# Patient Record
Sex: Female | Born: 2005 | Race: Black or African American | Hispanic: No | Marital: Single | Smoking: Never smoker
Health system: Southern US, Community
[De-identification: ages and names within clinical notes are randomized; demographics above are authoritative.]

## PROBLEM LIST (undated history)

## (undated) DIAGNOSIS — J45909 Unspecified asthma, uncomplicated: Secondary | ICD-10-CM

## (undated) DIAGNOSIS — L309 Dermatitis, unspecified: Secondary | ICD-10-CM

## (undated) HISTORY — DX: Dermatitis, unspecified: L30.9

## (undated) HISTORY — PX: OTHER SURGICAL HISTORY: SHX169

## (undated) HISTORY — DX: Unspecified asthma, uncomplicated: J45.909

---
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
## 2023-02-18 ENCOUNTER — Encounter: Payer: Self-pay | Admitting: *Deleted

## 2023-04-30 ENCOUNTER — Emergency Department: Payer: Medicaid Other

## 2023-04-30 ENCOUNTER — Encounter: Payer: Self-pay | Admitting: Emergency Medicine

## 2023-04-30 ENCOUNTER — Emergency Department
Admission: EM | Admit: 2023-04-30 | Discharge: 2023-04-30 | Disposition: A | Discharge status: Home / Self Care | Payer: Medicaid Other | Attending: Emergency Medicine | Admitting: Emergency Medicine

## 2023-04-30 ENCOUNTER — Other Ambulatory Visit: Payer: Self-pay

## 2023-04-30 DIAGNOSIS — J4521 Mild intermittent asthma with (acute) exacerbation: Secondary | ICD-10-CM | POA: Diagnosis not present

## 2023-04-30 DIAGNOSIS — R062 Wheezing: Secondary | ICD-10-CM | POA: Diagnosis present

## 2023-04-30 LAB — BASIC METABOLIC PANEL
Anion gap: 10 (ref 5–15)
BUN: 10 mg/dL (ref 6–20)
CO2: 20 mmol/L — ABNORMAL LOW (ref 22–32)
Calcium: 8.5 mg/dL — ABNORMAL LOW (ref 8.9–10.3)
Chloride: 103 mmol/L (ref 98–111)
Creatinine, Ser: 0.82 mg/dL (ref 0.44–1.00)
GFR, Estimated: >60 mL/min (ref >60)
Glucose, Bld: 89 mg/dL (ref 70–99)
Potassium: 3.1 mmol/L — ABNORMAL LOW (ref 3.5–5.1)
Sodium: 133 mmol/L — ABNORMAL LOW (ref 135–145)

## 2023-04-30 LAB — CBC
HCT: 33.5 % — ABNORMAL LOW (ref 36.0–46.0)
Hemoglobin: 11.1 g/dL — ABNORMAL LOW (ref 12.0–15.0)
MCH: 25.5 pg — ABNORMAL LOW (ref 26.0–34.0)
MCHC: 33.1 g/dL (ref 30.0–36.0)
MCV: 77 fL — ABNORMAL LOW (ref 80.0–100.0)
Platelets: 346 10*3/uL (ref 150–400)
RBC: 4.35 MIL/uL (ref 3.87–5.11)
RDW: 16.3 % — ABNORMAL HIGH (ref 11.5–15.5)
WBC: 7.4 10*3/uL (ref 4.0–10.5)
nRBC: 0 % (ref 0.0–0.2)

## 2023-04-30 LAB — TROPONIN I (HIGH SENSITIVITY): Troponin I (High Sensitivity): <2 ng/L (ref <18)

## 2023-04-30 MED ORDER — MOMETASONE FURO-FORMOTEROL FUM 100-5 MCG/ACT IN AERO: 2.0000 | INHALATION_SPRAY | Freq: Two times a day (BID) | RESPIRATORY_TRACT | 0 refills | Status: AC

## 2023-04-30 MED ORDER — PREDNISONE 20 MG PO TABS: 40.0000 mg | ORAL_TABLET | Freq: Every day | ORAL | 0 refills | Status: AC

## 2023-04-30 MED ORDER — ALBUTEROL SULFATE (2.5 MG/3ML) 0.083% IN NEBU: 2.5000 mg | INHALATION_SOLUTION | Freq: Four times a day (QID) | RESPIRATORY_TRACT | 1 refills | Status: AC | PRN

## 2023-04-30 MED ORDER — POTASSIUM CHLORIDE CRYS ER 20 MEQ PO TBCR
40.0000 meq | EXTENDED_RELEASE_TABLET | Freq: Once | ORAL | Status: DC
Start: 2023-04-30 — End: 2023-04-30
  Filled 2023-04-30: qty 2

## 2023-04-30 MED ORDER — COMPRESSOR/NEBULIZER MISC: 1.0000 [IU] | Freq: Once | 0 refills | Status: AC

## 2023-04-30 MED ORDER — PREDNISONE 20 MG PO TABS
60.0000 mg | ORAL_TABLET | Freq: Once | ORAL | Status: AC
  Administered 2023-04-30: 60 mg via ORAL
  Filled 2023-04-30: qty 3

## 2023-04-30 MED ORDER — ALBUTEROL SULFATE HFA 108 (90 BASE) MCG/ACT IN AERS: 1.0000 | INHALATION_SPRAY | RESPIRATORY_TRACT | 1 refills | Status: DC | PRN

## 2023-04-30 NOTE — Discharge Instructions (Addendum)
We believe that your symptoms are caused today by an exacerbation of your asthma.  Please take the prescribed medications and any medications that you have at home.  Follow up with your doctor as recommended.  If you develop any new or worsening symptoms, including but not limited to fever, persistent vomiting, worsening shortness of breath, or other symptoms that concern you, please return to the Emergency Department immediately. ° °

## 2023-04-30 NOTE — ED Provider Notes (Signed)
Vitals:   04/30/23 0040 04/30/23 0145  BP: 121/77 109/70  Pulse: (!) 104 (!) 101  Resp: (!) 24 19  Temp: 97.9 F (36.6 C) 98.2 F (36.8 C)  SpO2: 100% 98%     Patient resting comfortably, no distress.  She appears very appropriate for discharge.  Oxygen saturation and work of breathing are normal at this time.  Lung sounds are clear.  She reports a long history of asthma, I have refilled many of her medications that she has since run out of since she is now transitioning from pediatrics to adult care.  He has an appointment coming up early July to establish primary care.  No ongoing evidence of to suggest persistent exacerbation.  Return precautions and treatment recommendations and follow-up discussed with the patient who is agreeable with the plan.    Sharyn Creamer, MD 04/30/23 [phone]

## 2023-04-30 NOTE — ED Provider Notes (Signed)
[facility] Provider Note    Event Date/Time   First MD Initiated Contact with Patient 04/30/23 0100     (approximate)   History   Shortness of Breath and Asthma   HPI  Elaine Bright is a 17 y.o. female   with a history of asthma.  She reports that she recently started a new job.  She has been working in a fairly dusty environment and has noticed that she started having a little bit of wheezing today.  She has been out of her [facility] for some time and used to take but no longer takes an antihistamine like Zyrtec.    No chest pain.  She reports a little bit of shortness of breath and wheezing and is starting to feel improved.  She used her inhaler several times prior to calling EMS and was given steroid and albuterol by EMS.  She is feeling quite a bit better at this time.  Reports overall feeling much improved.     Physical Exam   Triage Vital Signs: ED Triage Vitals  Enc Vitals Group     BP 04/30/23 0040 121/77     Pulse Rate 04/30/23 0040 (!) 104     Resp 04/30/23 0040 (!) 24     Temp 04/30/23 0040 97.9 F (36.6 C)     Temp Source 04/30/23 0040 Oral     SpO2 04/30/23 0040 100 %     Weight 04/30/23 0041 143 lb (64.9 kg)     Height 04/30/23 0041 5\' 2"  (1.575 m)     Head Circumference --      Peak Flow --      Pain Score 04/30/23 0041 8     Pain Loc --      Pain Edu? --      Excl. in GC? --     Most recent vital signs: Vitals:   04/30/23 0040 04/30/23 0145  BP: 121/77 109/70  Pulse: (!) 104 (!) 101  Resp: (!) 24 19  Temp: 97.9 F (36.6 C) 98.2 F (36.8 C)  SpO2: 100% 98%     General: Awake, no distress.  CV:  Good peripheral perfusion.  Normal tones and rate Resp:  Normal effort.  Lung sounds clear except for very slight amount of expiratory wheezing.  Her work of breathing appears quite normal oxygen saturation normal.  She reports she is feeling well at this time Abd:  No distention.  No abdominal tenderness.  Denies  pregnancy Other:     ED Results / Procedures / Treatments   Labs (all labs ordered are listed, but only abnormal results are displayed) Labs Reviewed  BASIC METABOLIC PANEL - Abnormal; Notable for the following components:      Result Value   Sodium 133 (*)    Potassium 3.1 (*)    CO2 20 (*)    Calcium 8.5 (*)    All other components within normal limits  CBC - Abnormal; Notable for the following components:   Hemoglobin 11.1 (*)    HCT 33.5 (*)    MCV 77.0 (*)    MCH 25.5 (*)    RDW 16.3 (*)    All other components within normal limits  TROPONIN I (HIGH SENSITIVITY)     EKG EKG inter by me at 1:35 AM heart rate 95 QRS 90 QTc 460 Normal sinus rhythm no evidence of acute ischemia or ectopy   RADIOLOGY  Chest x-ray interpreted by me as negative for acute finding  DG Chest 2 View  Result Date: 04/30/2023 CLINICAL DATA:  Wheezing and shortness of breath. EXAM: CHEST - 2 VIEW COMPARISON:  June 29, 2022 FINDINGS: The heart size and mediastinal contours are within normal limits. Both lungs are clear. The visualized skeletal structures are unremarkable. IMPRESSION: No active cardiopulmonary disease. Electronically Signed   By: Aram Candela M.D.   On: 04/30/2023 01:13      PROCEDURES:  Critical Care performed: No  Procedures   MEDICATIONS ORDERED IN ED: Medications  predniSONE (DELTASONE) tablet 60 mg (60 mg Oral Given 04/30/23 0130)     IMPRESSION / MDM / ASSESSMENT AND PLAN / ED COURSE  I reviewed the triage vital signs and the nursing notes.                              Differential diagnosis includes, but is not limited to, suspect mild asthma exacerbation now improving after treatment prior to arrival by patient as well as EMS, with only slight amount of wheezing but she reports significant improvement in work of breathing.  No associated chest pain.  Reassuring examination.  History of asthma, she is currently transitioning between PCP who is new and  her pediatrician.  She is currently not taking Zyrtec or Dulera.  She also reports that she needs a new mask for her home nebulizer and refill of albuterol MDI.  There is no evidence of acute coronary syndrome reassuring EKG normal initial troponin low risk and low pretest probability for ACS.  No associated chest pain.  Mild hypokalemia, will replete with dose of potassium here.  CBC reassuring except for very mild anemia.  Anemia does not appear to be enough to cause dyspnea.  Reassessed the patient, lung sounds clear denies any ongoing dyspnea this time.  She reports significant improvement, is resting in quite comfortable at this time.  Have sent several prescriptions in for the patient She will also start Zyrtec over-the-counter.  Discussed with both patient and her mother very careful return precautions she is comfortable with plan  Patient's presentation is most consistent with acute complicated illness / injury requiring diagnostic workup.          FINAL CLINICAL IMPRESSION(S) / ED DIAGNOSES   Final diagnoses:  Mild intermittent asthma with exacerbation     Rx / DC Orders   ED Discharge Orders          Ordered    mometasone-formoterol (DULERA) 100-5 MCG/ACT AERO  2 times daily        04/30/23 0131    predniSONE (DELTASONE) 20 MG tablet  Daily with breakfast        04/30/23 0131    albuterol (VENTOLIN HFA) 108 (90 Base) MCG/ACT inhaler  Every 4 hours PRN        04/30/23 0131    Nebulizers (COMPRESSOR/NEBULIZER) MISC   Once        04/30/23 0131    albuterol (PROVENTIL) (2.5 MG/3ML) 0.083% nebulizer solution  Every 6 hours PRN        04/30/23 0131             Note:  This document was prepared using Dragon voice recognition software and may include unintentional dictation errors.   Sharyn Creamer, MD 04/30/23 [phone]
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
## 2023-05-22 ENCOUNTER — Ambulatory Visit: Payer: Self-pay | Admitting: Family Medicine

## 2023-05-22 ENCOUNTER — Encounter: Payer: Self-pay | Admitting: Family Medicine

## 2023-05-22 ENCOUNTER — Ambulatory Visit: Payer: Medicaid Other | Attending: Family Medicine | Admitting: Family Medicine

## 2023-05-22 VITALS — BP 104/72 | HR 82 | Temp 98.2°F | Ht 62.0 in | Wt 176.2 lb

## 2023-05-22 DIAGNOSIS — J4530 Mild persistent asthma, uncomplicated: Secondary | ICD-10-CM | POA: Diagnosis not present

## 2023-05-22 DIAGNOSIS — Z13228 Encounter for screening for other metabolic disorders: Secondary | ICD-10-CM | POA: Diagnosis not present

## 2023-05-22 MED ORDER — ALBUTEROL SULFATE HFA 108 (90 BASE) MCG/ACT IN AERS: 1.0000 | INHALATION_SPRAY | RESPIRATORY_TRACT | 3 refills | Status: AC | PRN

## 2023-05-22 MED ORDER — MISC. DEVICES MISC: 0 refills | Status: AC

## 2023-05-22 NOTE — Progress Notes (Signed)
ASTHMA

## 2023-05-22 NOTE — Patient Instructions (Signed)
Asthma Action Plan, Adult An asthma action plan helps you understand how to manage your asthma and what to do when you have an asthma attack. The action plan is a color-coded plan that lists the symptoms that indicate whether or not your condition is under control and what actions to take. If you have symptoms in the green zone, you are doing well. If you have symptoms in the yellow zone, you are having problems. If you have symptoms in the red zone, you need medical care right away. Follow the plan that you and your health care provider develop. Review your plan with your health care provider at each visit. What triggers your asthma? Knowing the things that can trigger an asthma attack or make your asthma symptoms worse is very important. Talk to your health care provider about your asthma triggers and how to avoid them. Record your known asthma triggers here: _______________ What is your personal best peak flow reading? If you use a peak flow meter, determine your personal best reading. Record it here: _______________ Green zone This zone means that your asthma is under control. You may not have any symptoms while you are in the green zone. This means that you: Have no coughing or wheezing, even while you are working or playing. Sleep through the night. Are breathing well. Have a peak flow reading that is above __________ (80% of your personal best or greater). If you are in the green zone, continue to manage your asthma as directed. Take these medicines every day: Controller medicine and dosage: _______________ Controller medicine and dosage: _______________ Controller medicine and dosage: _______________ Controller medicine and dosage: _______________ Before exercise, use this reliever or rescue medicine: _______________ Call your health care provider if you are using a reliever or rescue medicine more than 2-3 times a week. Yellow zone Symptoms in this zone mean that your condition may  be getting worse. You may have symptoms that interfere with exercise, are noticeably worse after exposure to triggers, or are worse at the first sign of a cold (upper respiratory infection). These may include: Waking from sleep. Coughing, especially at night or first thing in the morning. Mild wheezing. Chest tightness. A peak flow reading that is __________ to __________ (50-79% of your personal best). If you have any of these symptoms: Add the following medicine to the ones that you use daily: Reliever or rescue medicine and dosage: _______________ Additional medicine and dosage: _______________ Call your health care provider if: You remain in the yellow zone for __________ hours. You are using a reliever or rescue medicine more than 2-3 times a week. Red zone Symptoms in this zone mean that you should get medical help right away. You will likely feel distressed and have symptoms at rest that restrict your activity. You are in the red zone if: You are breathing hard and quickly. Your nose opens wide, your ribs show, and your neck muscles become visible when you breathe in. Your lips, fingers, or toes are a bluish color. You have trouble speaking in full sentences. Your peak flow reading is less than __________ (less than 50% of your personal best). Your symptoms do not improve within 15-20 minutes after you use your reliever or rescue medicine (bronchodilator). If you have any of these symptoms: These symptoms represent a serious problem that is an emergency. Do not wait to see if the symptoms will go away. Get medical help right away. Call your local emergency services (911 in the [place] Do not drive yourself   to the hospital. Use your reliever or rescue medicine. Start a nebulizer treatment or take 2-4 puffs from a metered-dose inhaler with a spacer. Repeat this action every 15-20 minutes until help arrives. Where to find more information You can find more information about asthma  from: Centers for Disease Control and Prevention: www.cdc.gov American Lung Association: www.lung.org This information is not intended to replace advice given to you by your health care provider. Make sure you discuss any questions you have with your health care provider. Document Revised: 12/14/2020 Document Reviewed: 12/14/2020 Elsevier Patient Education  2024 Elsevier Inc.  

## 2023-05-22 NOTE — Progress Notes (Signed)
Subjective:  Patient ID: Elaine Bright, female    DOB: 03-Feb-2006  Age: 17 y.o. MRN: [id number]  CC: New Patient (Initial Visit)   HPI Elaine Bright is a 17 y.o. year old female with a history of asthma who presents to establish care.  Interval History: Discussed the use of AI scribe software for clinical note transcription with the patient, who gave verbal consent to proceed.   The patient, with a history of childhood asthma and eczema, presents for a work note due to recent asthma exacerbations. She reports that her asthma was well controlled until she started working at an [employer] a few months ago. 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 If not controlled, she passes out. At home, her asthma is triggered by cockroaches, smoke, and extreme temperature changes. She uses Dulera twice daily and albuterol as needed for her asthma.  She is also requesting a prescription for nebulizer device so she can keep this at work. At the moment she has no asthma symptoms.       Past Medical History:  Diagnosis Date   Asthma    Eczema     Past Surgical History:  Procedure Laterality Date   no surgical history      Family History  Problem Relation Age of Onset   Asthma Mother    Eczema Sister    Asthma Brother    Allergic rhinitis Neg Hx    Angioedema Neg Hx    Immunodeficiency Neg Hx    Urticaria Neg Hx     Social History   Socioeconomic History   Marital status: Single    Spouse name: Not on file   Number of children: Not on file   Years of education: Not on file   Highest education level: Not on file  Occupational History   Not on file  Tobacco Use   Smoking status: Never   Smokeless tobacco: Never  Vaping Use   Vaping status: Never Used  Substance and Sexual Activity   Alcohol use: Never   Drug use: Never   Sexual activity: Never  Other Topics Concern   Not on file   Social History Narrative   Lives with mom, brother and sister   Social Determinants of Health   Financial Resource Strain: Not on file  Food Insecurity: Not on file  Transportation Needs: Not on file  Physical Activity: Not on file  Stress: Not on file  Social Connections: Not on file    Allergies  Allergen Reactions   American Cockroach Itching and Cough    Pt says if it touches her she feels like her throat is closing. She has the same reaction to grass   Maple Flavor Itching    Outpatient Medications Prior to Visit  Medication Sig Dispense Refill   acetaminophen (TYLENOL) 500 MG tablet Take 1,000 mg by mouth every 6 (six) hours as needed for moderate pain or headache.     albuterol (PROVENTIL) (2.5 MG/3ML) 0.083% nebulizer solution Take 3 mLs (2.5 mg total) by nebulization every 6 (six) hours as needed for wheezing or shortness of breath. 75 mL 1   amitriptyline (ELAVIL) 10 MG tablet Take 1 tablet (10 mg total) by mouth at bedtime. 30 tablet 0   mometasone-formoterol (DULERA) 100-5 MCG/ACT AERO Inhale 2 puffs into the lungs 2 (two) times daily. 1 each 0   naproxen (NAPROSYN) 375 MG tablet Take 1 tablet (375 mg total)  by mouth 2 (two) times daily. 20 tablet 0   ondansetron (ZOFRAN-ODT) 4 MG disintegrating tablet 4mg  ODT q4 hours prn nausea/vomit 10 tablet 0   albuterol (VENTOLIN HFA) 108 (90 Base) MCG/ACT inhaler Inhale 1-2 puffs into the lungs every 4 (four) hours as needed for wheezing or shortness of breath. 1 each 1   budesonide (PULMICORT) 0.5 MG/2ML nebulizer solution Take 0.25 mg by nebulization 2 (two) times daily. (Patient not taking: Reported on 05/22/2023)     cephALEXin (KEFLEX) 500 MG capsule Take 1 capsule (500 mg total) by mouth 4 (four) times daily. (Patient not taking: Reported on 06/18/2022) 28 capsule 0   fluticasone (FLONASE) 50 MCG/ACT nasal spray 2 sprays per nostril once a day if needed for stuffy nose (Patient not taking: Reported on 09/30/2022) 18.2 g 5    montelukast (SINGULAIR) 10 MG tablet Take 1 tablet once a day to prevent coughing or wheezing (Patient not taking: Reported on 05/22/2023) 30 tablet 5   No facility-administered medications prior to visit.     ROS Review of Systems  Constitutional:  Negative for activity change and appetite change.  HENT:  Negative for sinus pressure and sore throat.   Respiratory:  Negative for chest tightness, shortness of breath and wheezing.   Cardiovascular:  Negative for chest pain and palpitations.  Gastrointestinal:  Negative for abdominal distention, abdominal pain and constipation.  Genitourinary: Negative.   Musculoskeletal: Negative.   Psychiatric/Behavioral:  Negative for behavioral problems and dysphoric mood.     Objective:  BP 104/72   Pulse 82   Temp 98.2 F (36.8 C) (Oral)   Ht 5\' 2"  (1.575 m)   Wt 176 lb 3.2 oz (79.9 kg)   LMP 04/07/2023 (Within Days)   SpO2 100%   BMI 32.23 kg/m      05/22/2023   10:12 AM 04/30/2023    1:45 AM 04/30/2023   12:41 AM  BP/Weight  Systolic BP 104 109   Diastolic BP 72 70   Wt. (Lbs) 176.2  143  BMI 32.23 kg/m2  26.16 kg/m2      Physical Exam Constitutional:      Appearance: She is well-developed.  Cardiovascular:     Rate and Rhythm: Normal rate.     Heart sounds: Normal heart sounds. No murmur heard. Pulmonary:     Effort: Pulmonary effort is normal.     Breath sounds: Normal breath sounds. No wheezing or rales.  Chest:     Chest wall: No tenderness.  Abdominal:     General: Bowel sounds are normal. There is no distension.     Palpations: Abdomen is soft. There is no mass.     Tenderness: There is no abdominal tenderness.  Musculoskeletal:        General: Normal range of motion.     Right lower leg: No edema.     Left lower leg: No edema.  Neurological:     Mental Status: She is alert and oriented to person, place, and time.  Psychiatric:        Mood and Affect: Mood normal.        Latest Ref Rng & Units 04/30/2023    12:56 AM 12/04/2021    1:15 PM 06/24/2021    5:39 AM  CMP  Glucose 70 - 99 mg/dL 89  89  010   BUN 6 - 20 mg/dL 10  9  <5   Creatinine 0.44 - 1.00 mg/dL 2.72  5.36  6.44   Sodium 135 -  145 mmol/L 133  138  138   Potassium 3.5 - 5.1 mmol/L 3.1  3.6  3.1   Chloride 98 - 111 mmol/L 103  106  104   CO2 22 - 32 mmol/L 20  25  27    Calcium 8.9 - 10.3 mg/dL 8.5  9.1  8.3   Total Protein 6.5 - 8.1 g/dL  6.9    Total Bilirubin 0.3 - 1.2 mg/dL  0.5    Alkaline Phos 47 - 119 U/L  58    AST 15 - 41 U/L  17    ALT 0 - 44 U/L  9      Lipid Panel  No results found for: "CHOL", "TRIG", "HDL", "CHOLHDL", "VLDL", "LDLCALC", "LDLDIRECT"  CBC    Component Value Date/Time   WBC 7.4 04/30/2023 0056   RBC 4.35 04/30/2023 0056   HGB 11.1 (L) 04/30/2023 0056   HCT 33.5 (L) 04/30/2023 0056   PLT 346 04/30/2023 0056   MCV 77.0 (L) 04/30/2023 0056   MCH 25.5 (L) 04/30/2023 0056   MCHC 33.1 04/30/2023 0056   RDW 16.3 (H) 04/30/2023 0056   LYMPHSABS 1.7 12/04/2021 1315   MONOABS 0.5 12/04/2021 1315   EOSABS 0.1 12/04/2021 1315   BASOSABS 0.0 12/04/2021 1315    No results found for: "HGBA1C"  Assessment & Plan:      Asthma: Increased frequency of asthma attacks since starting work at [employer] due to dust exposure. Currently on Dulera twice daily and Albuterol as needed. Symptoms include throat constriction, headache, wheezing, and loss of consciousness during severe attacks. Triggers include dust, cockroaches, smoke, and extreme temperature changes. -Continue Dulera twice daily and Albuterol as needed. -Write a letter detailing asthma diagnosis and triggers for workplace accommodations. -Prescribe an additional nebulizer for use at work.  Hypokalemia: Previous lab results showed low potassium levels. -Order basic metabolic panel to recheck potassium and glucose levels.  Medication Refill: Albuterol inhaler running low. -Refill Albuterol prescription.         Meds ordered this  encounter  Medications   Misc. Devices MISC    Sig: Nebulizer device. Diagnosis - asthma    Dispense:  1 each    Refill:  0   albuterol (VENTOLIN HFA) 108 (90 Base) MCG/ACT inhaler    Sig: Inhale 1-2 puffs into the lungs every 4 (four) hours as needed for wheezing or shortness of breath.    Dispense:  1 each    Refill:  3    Follow-up: Return in about 6 months (around 11/22/2023) for Chronic medical conditions.       Hoy Register, MD, FAAFP. [facility] [place], [place] [phone]   05/22/2023, 11:04 AM

## 2023-05-23 LAB — BASIC METABOLIC PANEL
BUN/Creatinine Ratio: 12 (ref 9–23)
BUN: 9 mg/dL (ref 6–20)
CO2: 23 mmol/L (ref 20–29)
Calcium: 9.4 mg/dL (ref 8.7–10.2)
Chloride: 104 mmol/L (ref 96–106)
Creatinine, Ser: 0.77 mg/dL (ref 0.57–1.00)
Glucose: 87 mg/dL (ref 70–99)
Potassium: 4 mmol/L (ref 3.5–5.2)
Sodium: 141 mmol/L (ref 134–144)
eGFR: 115 mL/min/{1.73_m2} (ref >59)

## 2023-08-13 IMAGING — US US RENAL
1 series · 14 of 25 positions shown · non-contrast
Comparison: None.

CLINICAL DATA: Abdomen pain

EXAM:
RENAL / URINARY TRACT ULTRASOUND COMPLETE

[Series 1: us renal · 14 of 36 slices shown]
[im 1/36]
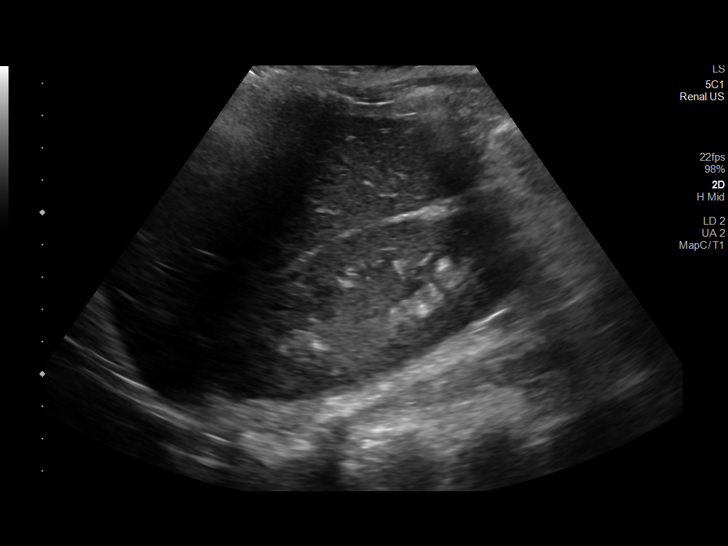
[im 3/36]
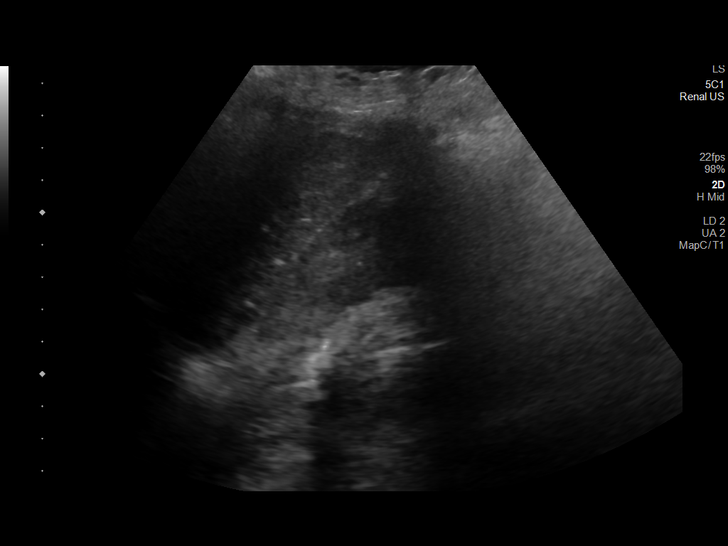
[im 6/36]
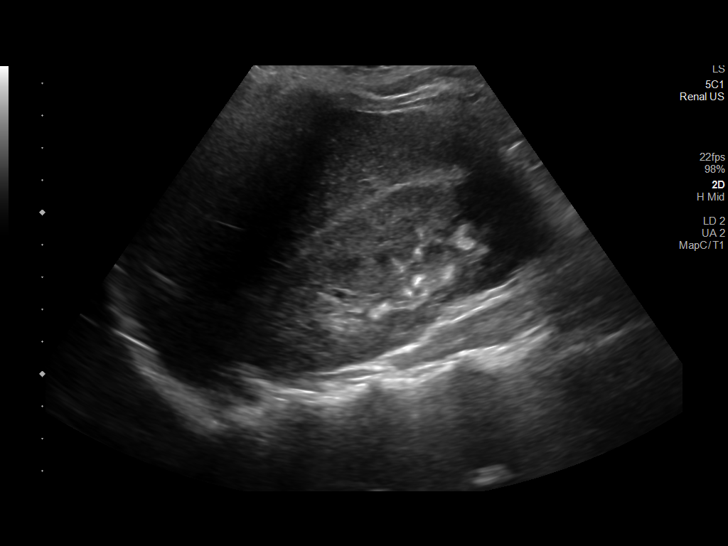
[im 9/36]
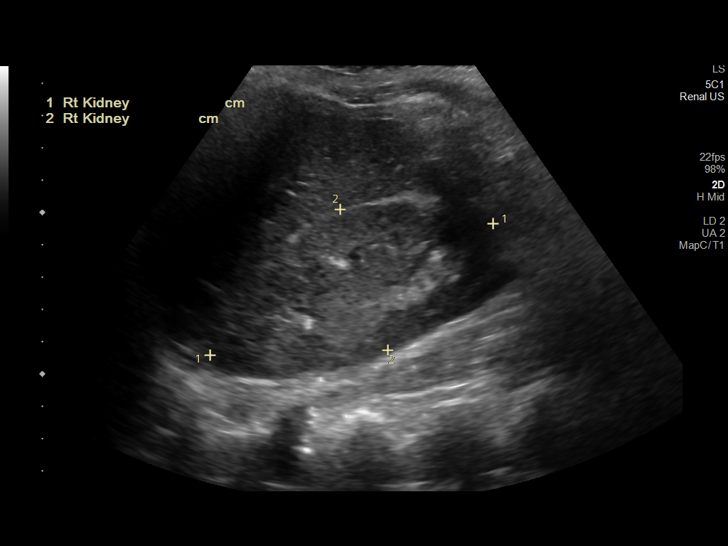
[im 12/36]
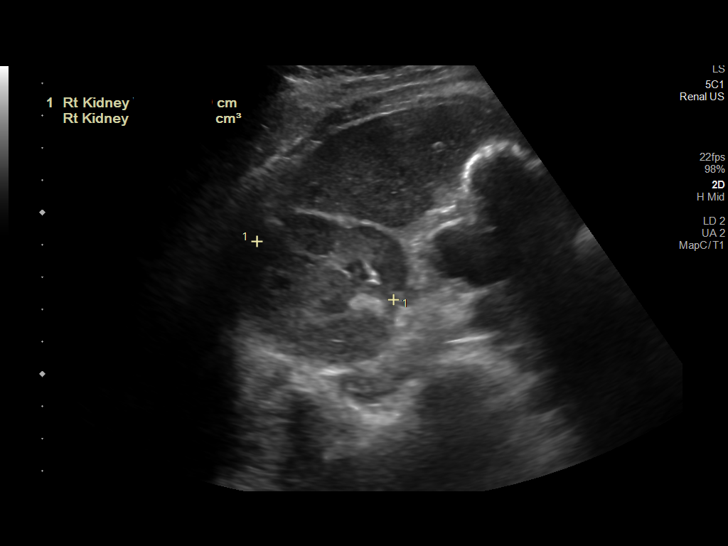
[im 14/36]
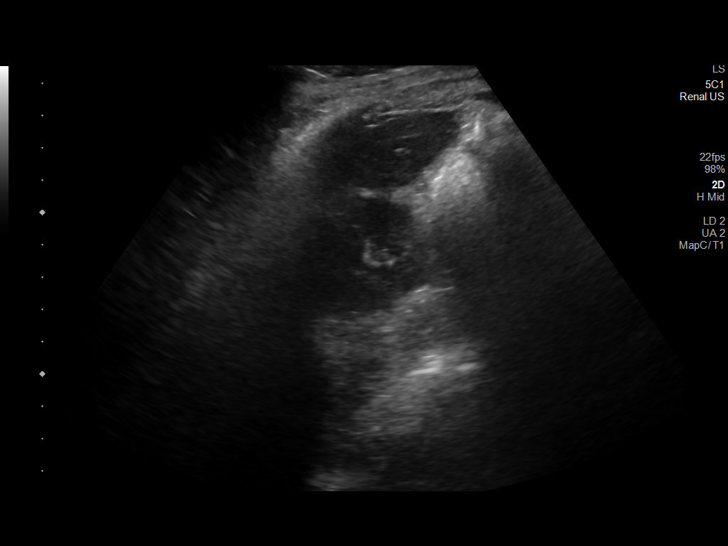
[im 17/36]
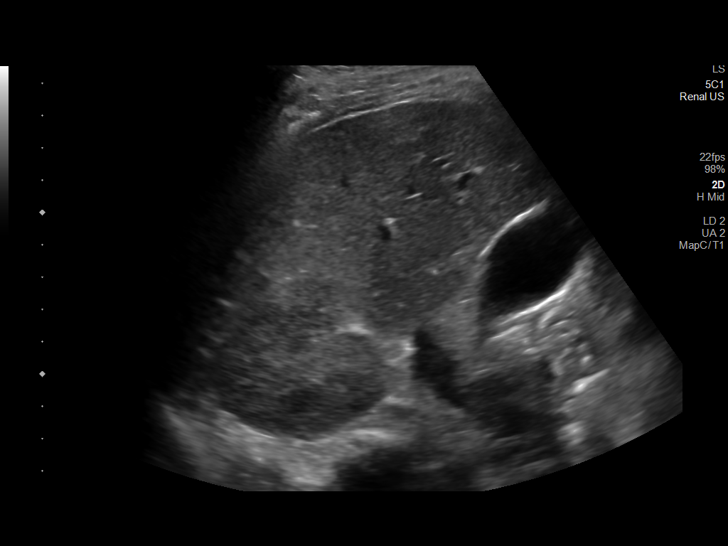
[im 19/36]
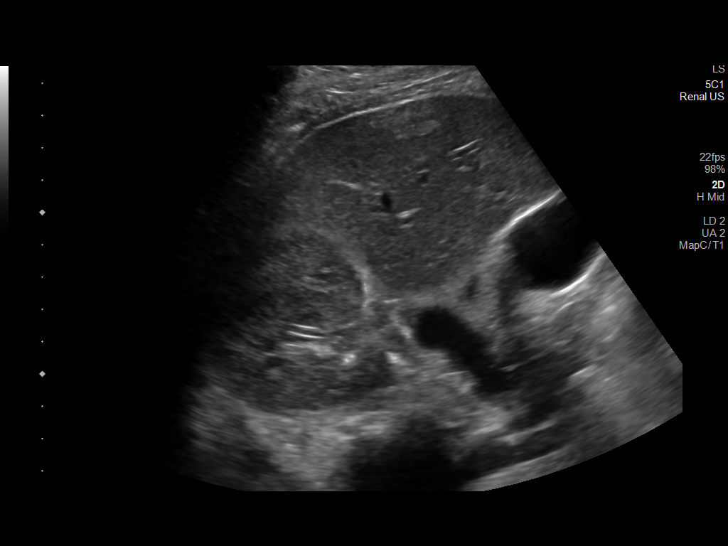
[im 22/36]
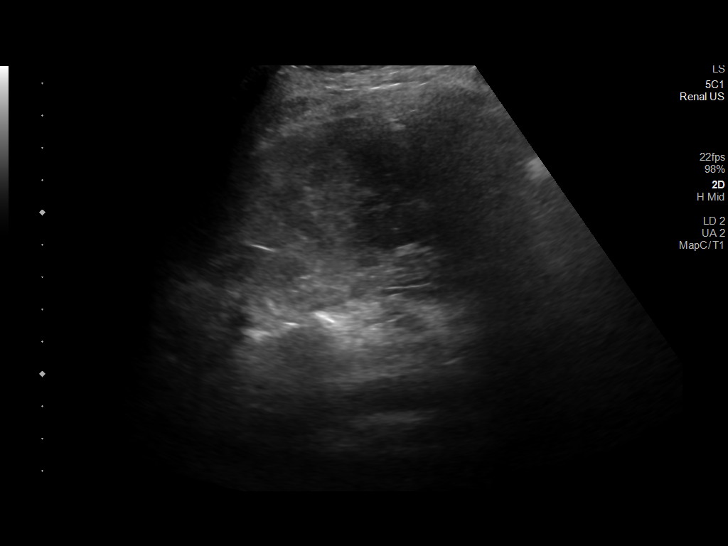
[im 24/36]
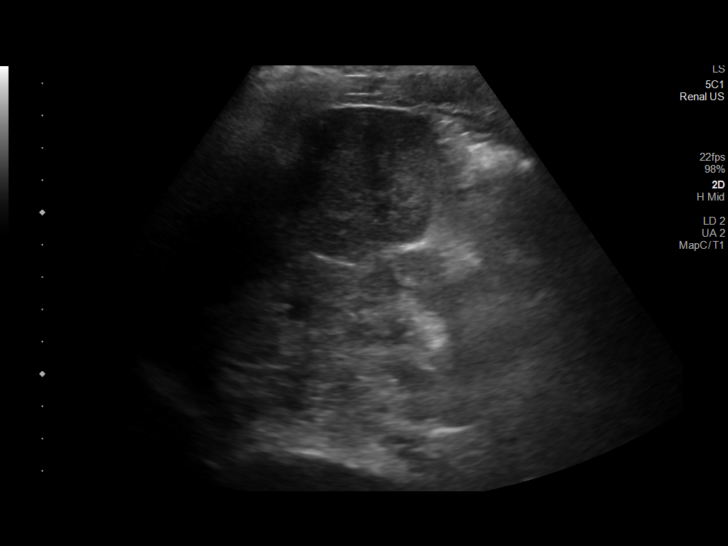
[im 27/36]
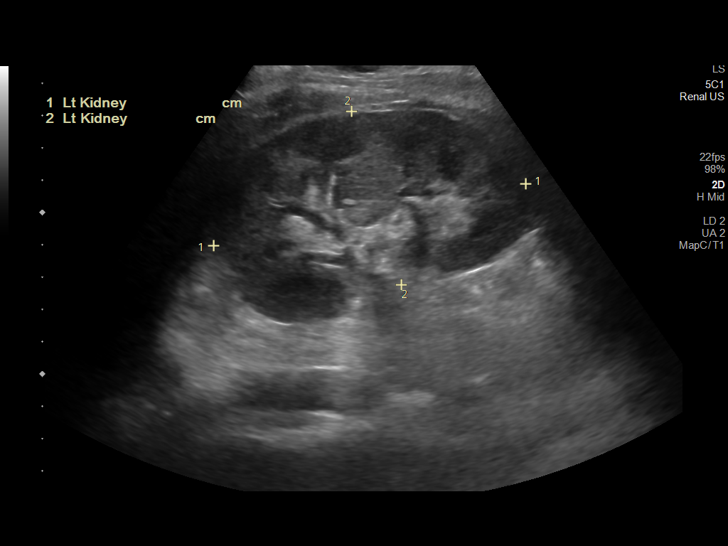
[im 30/36]
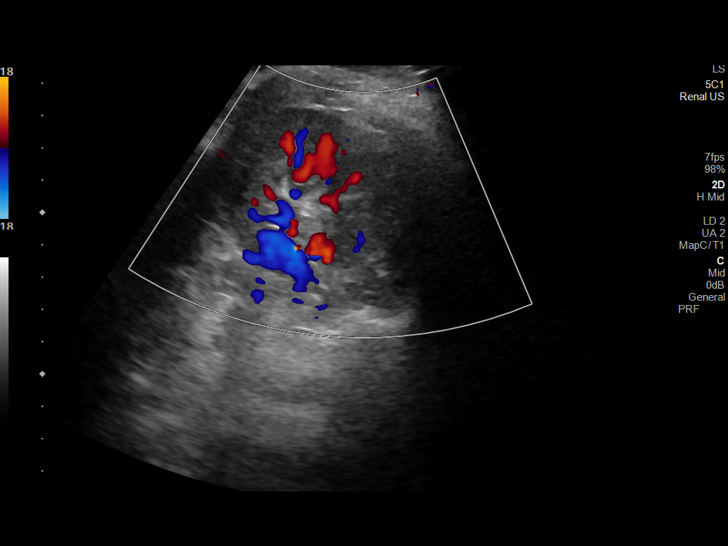
[im 33/36]
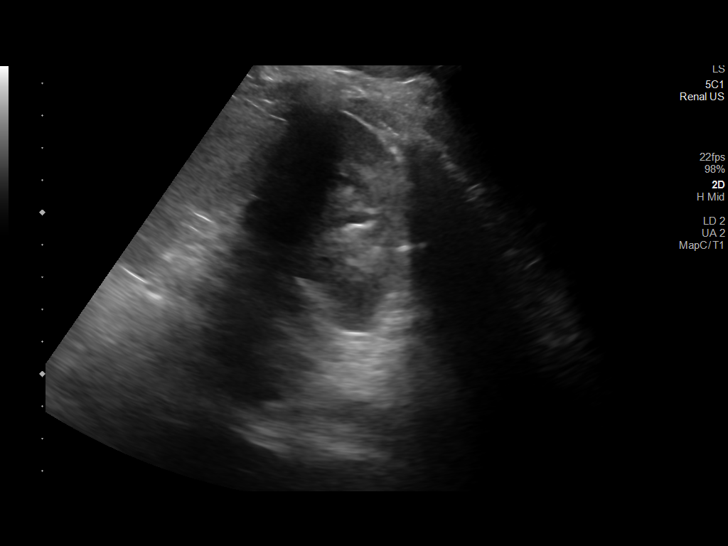
[im 36/36]
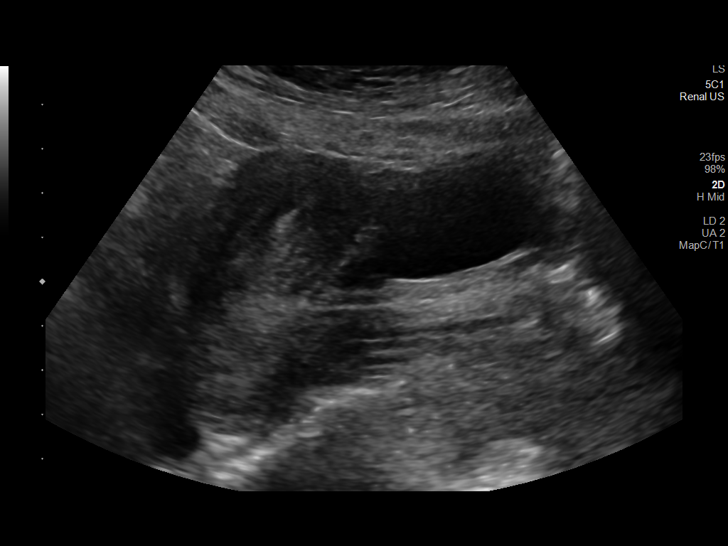

[14 of 25 positions shown; findings below may reference images not displayed]

FINDINGS: Right Kidney:

Renal measurements: 9.7 x 4.6 x 4.6 cm = volume: 106.8 mL.
Echogenicity within normal limits. No mass or hydronephrosis
visualized.

Left Kidney:

Renal measurements: 9.9 x 5.6 x 4.3 cm = volume: 123.7 mL.
Echogenicity within normal limits. No mass or hydronephrosis
visualized.

Bladder:

Appears normal for degree of bladder distention.

Other:

None.
IMPRESSION: Negative renal ultrasound

## 2023-08-13 IMAGING — US US ABDOMEN LIMITED
1 series · 12 of 12 positions shown · non-contrast
Comparison: None.

CLINICAL DATA: Abdomen pain

EXAM:
ULTRASOUND ABDOMEN LIMITED
TECHNIQUE: Gray scale imaging of the right lower quadrant was performed to
evaluate for suspected appendicitis. Standard imaging planes and
graded compression technique were utilized.

[Series 1: us appendix (abdomen limited) · 12 acquisitions, 12 frames shown]
[im 1/12]
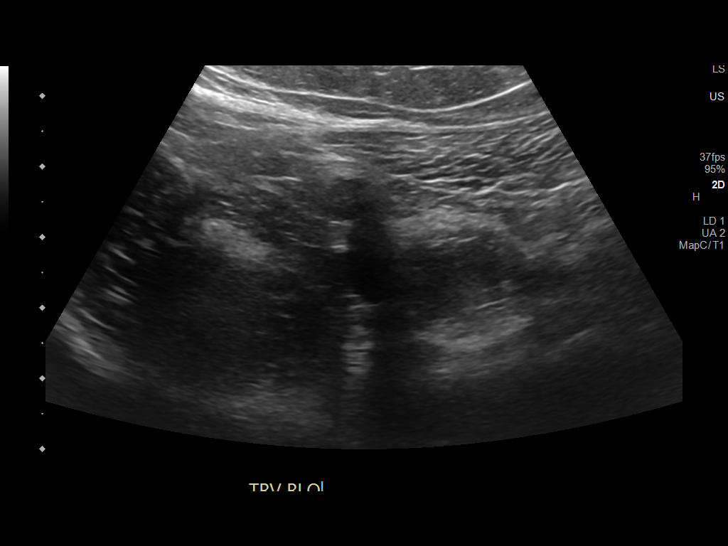
[im 2/12]
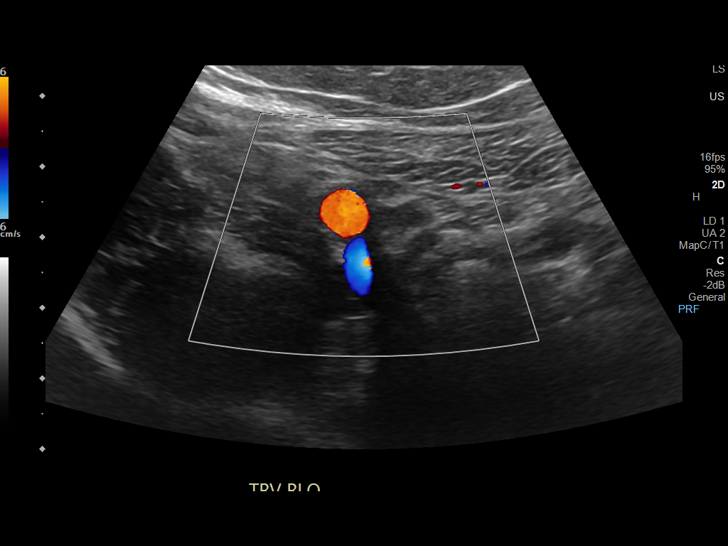
[im 3/12]
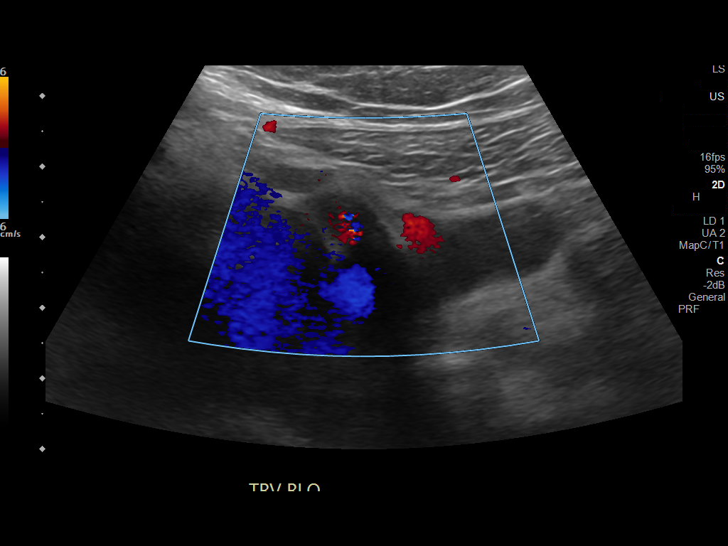
[im 4/12]
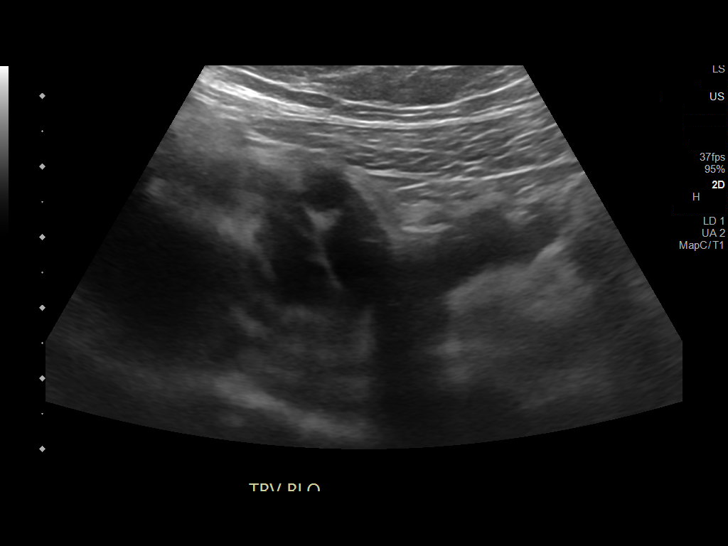
[im 5/12]
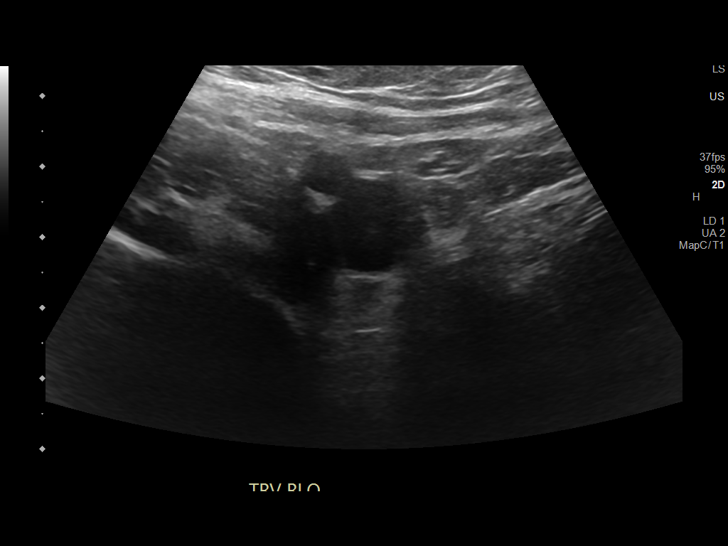
[im 6/12]
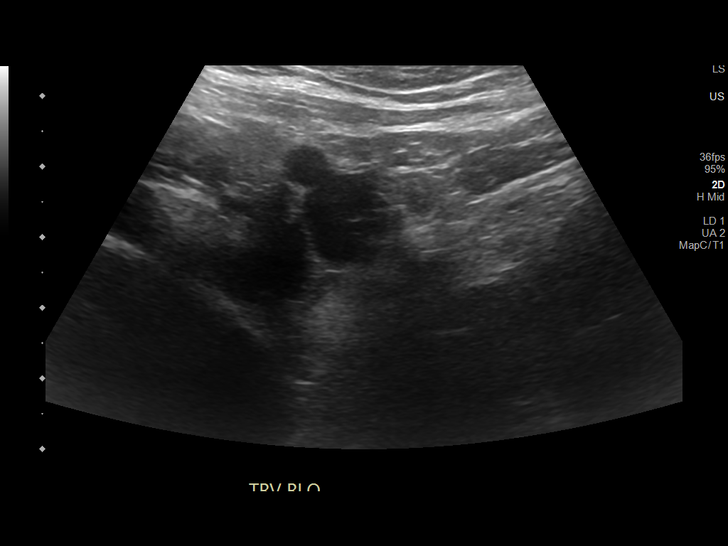
[im 7/12]
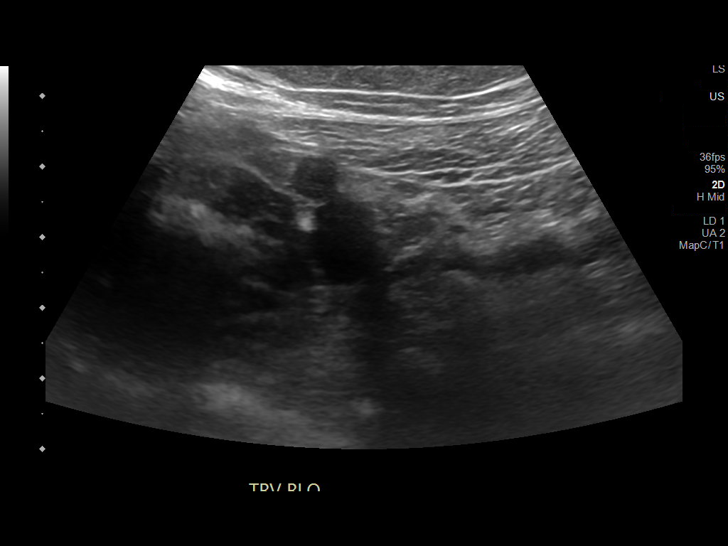
[im 8/12]
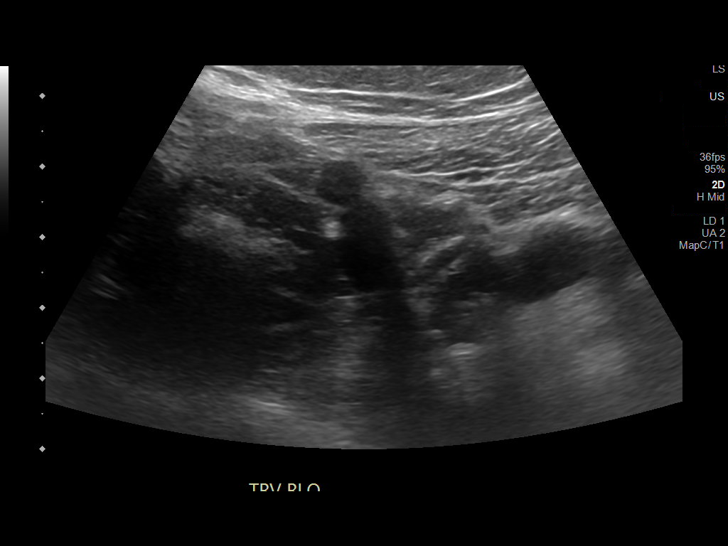
[im 9/12]
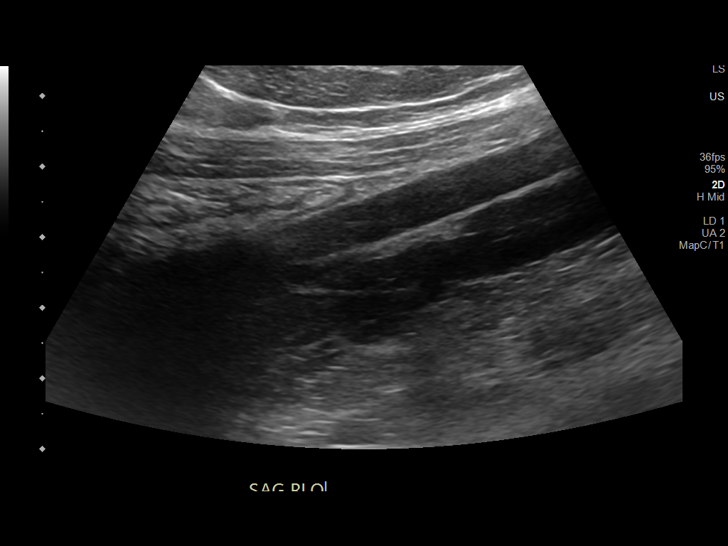
[im 10/12]
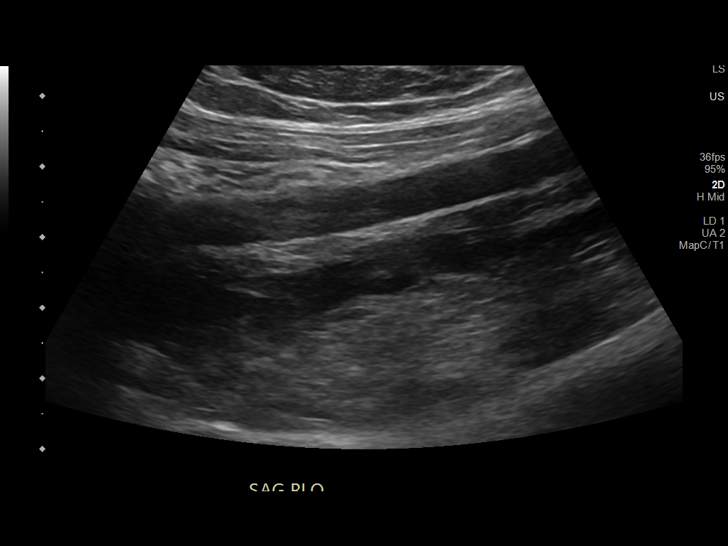
[im 11/12]
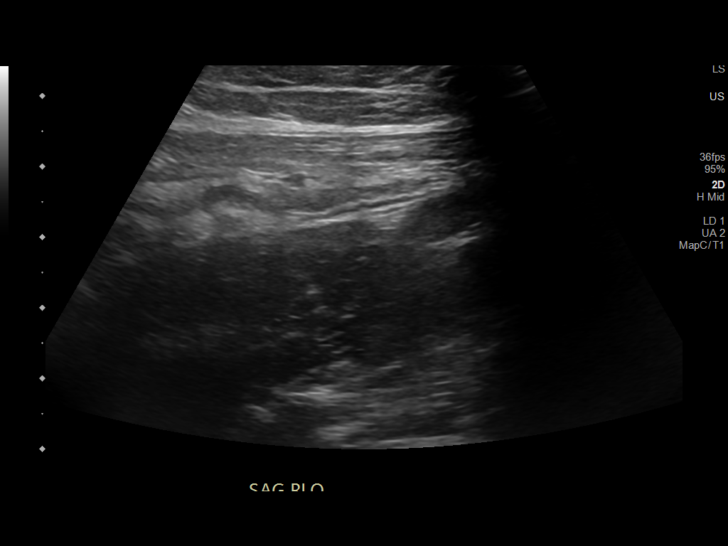
[im 12/12]
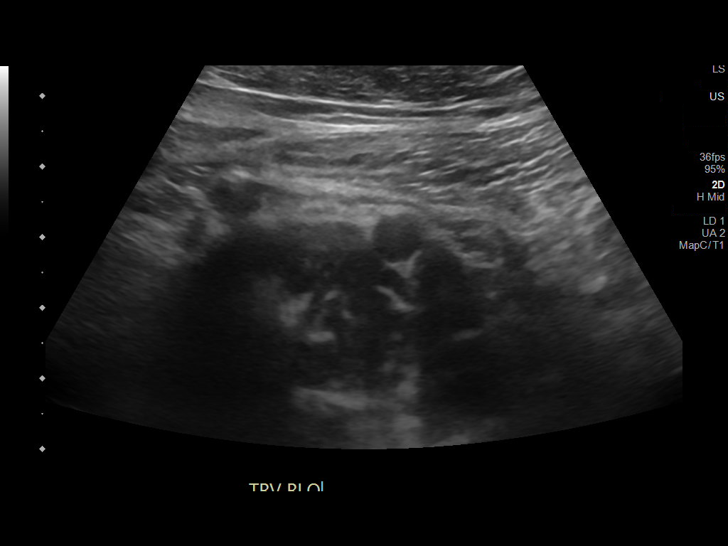

[12 of 12 positions shown; findings below may reference images not displayed]

FINDINGS: The appendix is not visualized.

Ancillary findings: None.

Factors affecting image quality: None.

Other findings: None.
IMPRESSION: Non visualization of the appendix. Non-visualization of appendix by
US does not definitely exclude appendicitis. If there is sufficient
clinical concern, consider abdomen pelvis CT with contrast for
further evaluation.

## 2023-10-30 NOTE — ED Triage Notes (Signed)
 EMS from home - Asthma Hx  Woke from sleep [facility], rescue inhaler didn't work. 2x Duo-Nebs and 125mg  Solu-Medrol  with EMS. Tachypneic and wheezing on arrival. No fevers or sick contacts.

## 2024-01-05 NOTE — ED Triage Notes (Signed)
 Patient brought in by EMS from home. Reports difficulty with asthma since last night. Used Bleach yesterday during the day and reports strong smells often causes trouble with her asthma. She used Nebs last night and inhaler today without relief. EMS gave 2 Duo Neb treatment, 125 solumedrol, 2grams magnesium with some relief.

## 2024-01-26 NOTE — ED Triage Notes (Signed)
 SHOB that started last night.  Used albuterol  and duoneb with no improvement.  3 breathing treatments prior to arrival at home.

## 2024-02-19 NOTE — ED Notes (Signed)
 Pt provided with cup of ice, per pt request and RN approval.   Theodoro Loges, CNA 02/19/24 2135

## 2024-02-19 NOTE — ED Triage Notes (Signed)
 Via [facility] from home, hx asthma with [facility] today. Used her inhaler without relief. Given 125 mg solumedrol, 2g mag, and x 2 dou nebs with EMS. RR 40-50/min, pt denies relief from nebs.

## 2024-06-15 ENCOUNTER — Emergency Department (HOSPITAL_COMMUNITY)
Admission: EM | Admit: 2024-06-15 | Discharge: 2024-06-15 | Disposition: A | Discharge status: Home / Self Care | Attending: Emergency Medicine | Admitting: Emergency Medicine

## 2024-06-15 ENCOUNTER — Other Ambulatory Visit: Payer: Self-pay

## 2024-06-15 ENCOUNTER — Emergency Department (HOSPITAL_COMMUNITY)

## 2024-06-15 DIAGNOSIS — R0602 Shortness of breath: Secondary | ICD-10-CM | POA: Diagnosis present

## 2024-06-15 DIAGNOSIS — J45901 Unspecified asthma with (acute) exacerbation: Secondary | ICD-10-CM | POA: Diagnosis not present

## 2024-06-15 NOTE — ED Provider Notes (Signed)
 [facility] DEPT [facility] Emergency Department Provider Note MRN:  [id number]  Arrival date & time: 06/15/24     Chief Complaint   Shortness of Breath   History of Present Illness   Elaine Bright is a 18 y.o. year-old female with a history of asthma presenting to the ED with chief complaint of shortness of breath.  2 nights in a row patient has had some trouble breathing at night.  Thinks maybe it is because the home is too cold and it is triggering asthma attacks.  [name] and despite multiple inhaler treatments she could not get her breathing under control and EMS was called.  EMS provided Solu-Medrol  and magnesium.  She is feeling much better at this time.  Endorsing some mild chest tightness but no pain, also improved.  Denies recent fever or cough or other illnesses.  Review of Systems  A thorough review of systems was obtained and all systems are negative except as noted in the HPI and PMH.   Patient's Health History    Past Medical History:  Diagnosis Date   Asthma    Eczema     Past Surgical History:  Procedure Laterality Date   no surgical history      Family History  Problem Relation Age of Onset   Asthma Mother    Eczema Sister    Asthma Brother    Allergic rhinitis Neg Hx    Angioedema Neg Hx    Immunodeficiency Neg Hx    Urticaria Neg Hx     Social History   Socioeconomic History   Marital status: Single    Spouse name: Not on file   Number of children: Not on file   Years of education: Not on file   Highest education level: Not on file  Occupational History   Not on file  Tobacco Use   Smoking status: Never   Smokeless tobacco: Never  Vaping Use   Vaping status: Never Used  Substance and Sexual Activity   Alcohol use: Never   Drug use: Never   Sexual activity: Never  Other Topics Concern   Not on file  Social History Narrative   Lives with mom, brother and sister   Social Drivers of Research scientist (physical sciences) Strain: Not on file  Food Insecurity: Not on file  Transportation Needs: Not on file  Physical Activity: Not on file  Stress: Not on file  Social Connections: Not on file  Intimate Partner Violence: Not on file     Physical Exam   Vitals:   06/15/24 0055 06/15/24 0145  BP:  109/78  Pulse: 88 95  Resp: (!) 21 (!) 22  Temp:    SpO2: 100% 100%    CONSTITUTIONAL: Well-appearing, NAD NEURO/PSYCH:  Alert and oriented x 3, no focal deficits EYES:  eyes equal and reactive ENT/NECK:  no LAD, no JVD CARDIO: Regular rate, well-perfused, normal S1 and S2 PULM:  CTAB no wheezing or rhonchi GI/GU:  non-distended, non-tender MSK/SPINE:  No gross deformities, no edema SKIN:  no rash, atraumatic   *Additional and/or pertinent findings included in MDM below  Diagnostic and Interventional Summary    EKG Interpretation Date/Time:  Tuesday June 15 2024 00:41:26 EDT Ventricular Rate:  101 PR Interval:  156 QRS Duration:  88 QT Interval:  361 QTC Calculation: 468 R Axis:   73  Text Interpretation: Sinus tachycardia Confirmed by Theadore Sharper [phone]) on 06/15/2024 2:29:00 AM  Labs Reviewed - No data to display  DG Chest [facility]  Final Result      Medications - No data to display   Procedures  /  Critical Care Procedures  ED Course and Medical Decision Making  Initial Impression and Ddx Asthma exacerbation, seems to be doing much better, normal vitals and largely clear lungs, minimal tachypnea, will continue to monitor, screening EKG and chest x-ray.  No signs of DVT, no history of DVT or PE, highly doubt this pathology.  Past medical/surgical history that increases complexity of ED encounter: Asthma  Interpretation of Diagnostics I personally reviewed the EKG and my interpretation is as follows: Sinus rhythm  Chest x-ray unremarkable  Patient Reassessment and Ultimate Disposition/Management     Continues to do well on reassessment, seems consistent  with asthma exacerbation that is much improved, appropriate for discharge.  Patient management required discussion with the following services or consulting groups:  None  Complexity of Problems Addressed Acute illness or injury that poses threat of life of bodily function  Additional Data Reviewed and Analyzed Further history obtained from: Further history from spouse/family member  Additional Factors Impacting ED Encounter Risk Consideration of hospitalization  Ozell HERO. Theadore, MD [facility] Emergency Medicine [facility] [email or web address]  Final Clinical Impressions(s) / ED Diagnoses     ICD-10-CM   1. Exacerbation of asthma, unspecified asthma severity, unspecified whether persistent  J45.901       ED Discharge Orders     None        Discharge Instructions Discussed with and Provided to Patient:     Discharge Instructions      You were evaluated in the Emergency Department and after careful evaluation, we did not find any emergent condition requiring admission or further testing in the hospital.  Your exam/testing today is overall reassuring.  EKG and chest x-ray did not show any significant abnormalities.  Suspect your symptoms are related to a flare of your asthma.  Continue your inhaler medicines at home.  Please return to the Emergency Department if you experience any worsening of your condition.   Thank you for allowing us  to be a part of your care.       Theadore Ozell HERO, MD 06/15/24 [phone]

## 2024-06-15 NOTE — ED Triage Notes (Signed)
 Pt bib [facility] from home where she became short of breath around 2 hours prior to arrival. Pt tried 2 breathing treatments at home without relief and was given 1g mag, 125mg  solumedrol, 3 duo nebs and 0.5mg  atrovent  en route. Pt arrives still wheezing and complaining of chest pain but states she feels better than she did. Pt has had to be intubated in the past for asthma.

## 2024-06-15 NOTE — Discharge Instructions (Signed)
 You were evaluated in the Emergency Department and after careful evaluation, we did not find any emergent condition requiring admission or further testing in the hospital.  Your exam/testing today is overall reassuring.  EKG and chest x-ray did not show any significant abnormalities.  Suspect your symptoms are related to a flare of your asthma.  Continue your inhaler medicines at home.  Please return to the Emergency Department if you experience any worsening of your condition.   Thank you for allowing us  to be a part of your care.

## 2024-09-27 ENCOUNTER — Emergency Department (HOSPITAL_COMMUNITY)
Admission: EM | Admit: 2024-09-27 | Discharge: 2024-09-27 | Disposition: A | Discharge status: Home / Self Care | Attending: Emergency Medicine | Admitting: Emergency Medicine

## 2024-09-27 ENCOUNTER — Encounter (HOSPITAL_COMMUNITY): Payer: Self-pay

## 2024-09-27 ENCOUNTER — Other Ambulatory Visit: Payer: Self-pay

## 2024-09-27 DIAGNOSIS — Z7952 Long term (current) use of systemic steroids: Secondary | ICD-10-CM | POA: Insufficient documentation

## 2024-09-27 DIAGNOSIS — R0602 Shortness of breath: Secondary | ICD-10-CM | POA: Diagnosis present

## 2024-09-27 DIAGNOSIS — J4541 Moderate persistent asthma with (acute) exacerbation: Secondary | ICD-10-CM | POA: Insufficient documentation

## 2024-09-27 MED ORDER — IPRATROPIUM-ALBUTEROL 0.5-2.5 (3) MG/3ML IN SOLN
3.0000 mL | Freq: Once | RESPIRATORY_TRACT | Status: AC
  Administered 2024-09-27: 3 mL via RESPIRATORY_TRACT
  Filled 2024-09-27: qty 3

## 2024-09-27 MED ORDER — PREDNISONE 20 MG PO TABS: 40.0000 mg | ORAL_TABLET | Freq: Every day | ORAL | 0 refills | Status: AC

## 2024-09-27 NOTE — ED Triage Notes (Signed)
 Pt BIB EMS from school due to respiratory distress post asthma attack. Pt report attack occurred due to weather changing. Last attack about 1 month ago. Expiratory wheezing noted.  En Route 10 mg Albuterol  1 mg Atrovent  125 mg Solu-Medrol  20 ga IV RAC

## 2024-09-27 NOTE — Discharge Instructions (Signed)
 It was a pleasure caring for you today in the emergency department.  You were seen for an asthma exacerbation receive breathing treatments and IV steroids.  Please continue to take the oral steroids starting tomorrow to complete a 5-day course.  Please use your Dulera at home 2 puffs twice per day for maintenance and you can utilize it up to an additional 6 times for rescue for recurrent shortness of breath.  Please follow-up with your PCP for continued valuation of your asthma.  Please return to the emergency department for any worsening or worrisome symptoms.

## 2024-09-27 NOTE — ED Provider Notes (Signed)
 [place] EMERGENCY DEPARTMENT AT [facility] Provider Note  CSN: [id number] Arrival date & time: 09/27/24 9080  Chief Complaint(s) Respiratory Distress  HPI Elaine Bright is a 18 y.o. female with past medical history as below, significant for asthma, allergies who presents to the ED with complaint of shortness of breath.  Reports sudden onset of shortness of breath that around 8 AM this morning just after she got to school.  Denies any recent cough, congestion, fever or illness.  Denies N/V/D and sick contacts.  Normal intake and output. On Dulera 100, 2 puffs twice daily, taking at home. Transferred via EMS, received albuterol , Atrovent , Solu-Medrol  en route.  Some improvement in breathing but still having shortness of breath.  Last exacerbation about a month ago.  Past Medical History Past Medical History:  Diagnosis Date   Asthma    Eczema    Patient Active Problem List   Diagnosis Date Noted   Sprain of anterior talofibular ligament of right ankle 04/11/2022   Leg length discrepancy 04/11/2022   E. coli pyelonephritis 06/21/2021   Moderate dehydration 06/21/2021   Other allergic rhinitis 12/09/2018   Mild persistent asthma without complication 12/09/2018   Seasonal allergic conjunctivitis 12/09/2018   Home Medication(s) Prior to Admission medications   Medication Sig Start Date End Date Taking? Authorizing Provider  predniSONE  (DELTASONE ) 20 MG tablet Take 2 tablets (40 mg total) by mouth daily for 4 days. 09/28/24 10/02/24 Yes Theophilus Pagan, MD  acetaminophen  (TYLENOL ) 500 MG tablet Take 1,000 mg by mouth every 6 (six) hours as needed for moderate pain or headache.    [provider]  albuterol  (PROVENTIL ) (2.5 MG/3ML) 0.083% nebulizer solution Take 3 mLs (2.5 mg total) by nebulization every 6 (six) hours as needed for wheezing or shortness of breath. 04/30/23   Dicky Anes, MD  albuterol  (VENTOLIN  HFA) 108 (90 Base) MCG/ACT inhaler Inhale 1-2 puffs  into the lungs every 4 (four) hours as needed for wheezing or shortness of breath. 05/22/23   Newlin, Enobong, MD  amitriptyline  (ELAVIL ) 10 MG tablet Take 1 tablet (10 mg total) by mouth at bedtime. 09/30/22   Abdelmoumen, Imane, MD  budesonide  (PULMICORT ) 0.5 MG/2ML nebulizer solution Take 0.25 mg by nebulization 2 (two) times daily. Patient not taking: Reported on 05/22/2023    [provider]  cephALEXin  (KEFLEX ) 500 MG capsule Take 1 capsule (500 mg total) by mouth 4 (four) times daily. Patient not taking: Reported on 06/18/2022 12/04/21   Kehrli, Kelsey F, PA-C  fluticasone  (FLONASE ) 50 MCG/ACT nasal spray 2 sprays per nostril once a day if needed for stuffy nose Patient not taking: Reported on 09/30/2022 12/09/18   Asa Aloysius LABOR, MD  Misc. Devices MISC Nebulizer device. Diagnosis - asthma 05/22/23   Newlin, Enobong, MD  mometasone -formoterol  (DULERA) 100-5 MCG/ACT AERO Inhale 2 puffs into the lungs 2 (two) times daily. 04/30/23   Dicky Anes, MD  montelukast  (SINGULAIR ) 10 MG tablet Take 1 tablet once a day to prevent coughing or wheezing Patient not taking: Reported on 05/22/2023 12/09/18   Asa Aloysius LABOR, MD  naproxen  (NAPROSYN ) 375 MG tablet Take 1 tablet (375 mg total) by mouth 2 (two) times daily. 04/02/22   Hildegard Loge, PA-C  ondansetron  (ZOFRAN -ODT) 4 MG disintegrating tablet 4mg  ODT q4 hours prn nausea/vomit 12/04/21   Kehrli, Kelsey F, PA-C  Past Surgical History Past Surgical History:  Procedure Laterality Date   no surgical history     Family History Family History  Problem Relation Age of Onset   Asthma Mother    Eczema Sister    Asthma Brother    Allergic rhinitis Neg Hx    Angioedema Neg Hx    Immunodeficiency Neg Hx    Urticaria Neg Hx     Social History Social History   Tobacco Use   Smoking status: Never   Smokeless tobacco:  Never  Vaping Use   Vaping status: Never Used  Substance Use Topics   Alcohol use: Never   Drug use: Never   Allergies Shellfish allergy , American cockroach, and Maple flavoring agent (non-screening)  Review of Systems A thorough review of systems was obtained and all systems are negative except as noted in the HPI and PMH.   Physical Exam Vital Signs  I have reviewed the triage vital signs BP (!) 106/90 (BP Location: Left Arm)   Pulse (!) 104   Temp 97.9 F (36.6 C) (Oral)   Resp (!) 24   SpO2 100%  Physical Exam General: Well-appearing. Alert. NAD HEENT: Normocephalic. White sclera.  Nonerythematous oropharynx.  No rhinorrhea or congestion CV: RRR without murmur Pulm: Diffuse end expiratory wheezing throughout all lung fields.  Mild tachypnea.  Able to speak in 5-6 word sentences.  No retractions noted. Ext: Well perfused. Cap refill < 3 seconds Skin: Warm, dry. No rashes noted  ED Results and Treatments Labs (all labs ordered are listed, but only abnormal results are displayed) Labs Reviewed - No data to display                                                                                                                        Radiology No results found.  Pertinent labs & imaging results that were available during my care of the patient were reviewed by me and considered in my medical decision making (see MDM for details).  Medications Ordered in ED Medications  ipratropium-albuterol  (DUONEB) 0.5-2.5 (3) MG/3ML nebulizer solution 3 mL (3 mLs Nebulization Given 09/27/24 0955)  ipratropium-albuterol  (DUONEB) 0.5-2.5 (3) MG/3ML nebulizer solution 3 mL (3 mLs Nebulization Given 09/27/24 1033)  Medical Decision Making / ED Course    Medical Decision Making:    Elaine Bright is a 18 y.o. female with PMHx asthma presenting with acute  onset shortness of breath. The complaint involves an extensive differential diagnosis and also carries with it a high risk of complications and morbidity.  Serious etiology was considered. Ddx includes but is not limited to: Asthma exacerbation, pneumonia, pneumothorax, PE  Complete initial physical exam performed, notably the patient was in no distress.    Reviewed and confirmed nursing documentation for past medical history, family history, social history.  Vital signs reviewed.    18 year old female with history as above presenting with shortness of breath with diffuse expiratory wheezing likely asthma exacerbation.  Received albuterol , Atrovent , Solu-Medrol  via EMS with some improvement but continues to have mild tachypnea diffuse wheezing.  Will treat symptomatically with DuoNebs.  Nonfocal respiratory exam and no sick symptoms therefore lower concern for pneumonia, will defer CXR at this time.  No history of VTE, low concern for PE.   Clinical Course as of 09/27/24 1117  Mon Sep 27, 2024  1030 Some symptomatic improvement after DuoNebs, will retrial breathing treatment. [KH]  1114 Upon reevaluation patient tearful and adamantly requesting to be discharged.  States she feels better and would like to go home.  Patient asked if anything had occurred and why she was tearful, she declined to answer and stated she would just like to go home.  Patient with normal work of breathing on room air and adequate oxygen saturation, acceptable for discharge.  Patient has Dulera and albuterol  available at home, will send with prednisone  to complete 5-day course recommend PCP follow-up. [KH]    Clinical Course User Index [KH] Theophilus Pagan, MD    Medicines ordered and prescription drug management: Meds ordered this encounter  Medications   ipratropium-albuterol  (DUONEB) 0.5-2.5 (3) MG/3ML nebulizer solution 3 mL   ipratropium-albuterol  (DUONEB) 0.5-2.5 (3) MG/3ML nebulizer solution 3 mL   predniSONE   (DELTASONE ) 20 MG tablet    Sig: Take 2 tablets (40 mg total) by mouth daily for 4 days.    Dispense:  8 tablet    Refill:  0    -I have reviewed the patients home medicines and have made adjustments as needed  Cardiac Monitoring: The patient was maintained on a cardiac monitor.  I personally viewed and interpreted the cardiac monitored which showed an underlying rhythm of: NSR Continuous pulse oximetry interpreted by myself, 100% on RA.   Reevaluation: After the interventions noted above, I reevaluated the patient and found that they have improved  Co morbidities that complicate the patient evaluation  Past Medical History:  Diagnosis Date   Asthma    Eczema       Dispostion: Disposition decision including need for hospitalization was considered, and patient discharged from emergency department.    Final Clinical Impression(s) / ED Diagnoses Final diagnoses:  Moderate persistent asthma with exacerbation        Theophilus Pagan, MD 09/27/24 1118    Laurice Maude BROCKS, MD 09/27/24 [phone]

## 2024-11-02 ENCOUNTER — Encounter: Payer: Self-pay | Admitting: Family

## 2024-11-02 ENCOUNTER — Ambulatory Visit (INDEPENDENT_AMBULATORY_CARE_PROVIDER_SITE_OTHER): Admitting: Family

## 2024-11-02 VITALS — BP 120/73 | HR 82 | Temp 98.2°F | Resp 16 | Ht 64.0 in | Wt 177.0 lb

## 2024-11-02 DIAGNOSIS — N946 Dysmenorrhea, unspecified: Secondary | ICD-10-CM

## 2024-11-02 MED ORDER — IBUPROFEN 800 MG PO TABS: 800.0000 mg | ORAL_TABLET | Freq: Three times a day (TID) | ORAL | 0 refills | Status: AC | PRN

## 2024-11-02 MED ORDER — LEVONORGEST-ETH ESTRAD 91-DAY 0.1-0.02 & 0.01 MG PO TABS: 1.0000 | ORAL_TABLET | Freq: Every day | ORAL | 4 refills | Status: AC

## 2024-11-02 NOTE — Progress Notes (Unsigned)
 "  Acute Office Visit  Subjective:     Patient ID: Elaine Bright, female    DOB: 06/03/2006, 18 y.o.   MRN: [id number]  Chief Complaint  Patient presents with   Menstrual Problem    Patient is here because she has heavy periods. Increased cramping and pain, nausea and lightheadedness while on periods.    HPI Patient is in today with concerns of heavier, painful menstrual cycles over the last 4 months.  Her cycles are lasting about 5 days.  She has been taking Midol  to help deal with the cramping.  Admits to nausea and vomiting during these times.  She is sexually active with 1 female.  Unprotected.  No concerns of STIs.  Denies any vaginal discharge or odor.  No family history of any female issues or fibroids.  Review of Systems  Respiratory: Negative.    Cardiovascular: Negative.   Gastrointestinal:  Positive for nausea and vomiting.  Genitourinary:  Negative for dysuria and urgency.       Heavy menstrual bleeding with increased pain  All other systems reviewed and are negative.  Past Medical History:  Diagnosis Date   Asthma    Eczema     Social History   Socioeconomic History   Marital status: Single    Spouse name: Not on file   Number of children: Not on file   Years of education: Not on file   Highest education level: Not on file  Occupational History   Not on file  Tobacco Use   Smoking status: Never   Smokeless tobacco: Never  Vaping Use   Vaping status: Never Used  Substance and Sexual Activity   Alcohol use: Never   Drug use: Never   Sexual activity: Never  Other Topics Concern   Not on file  Social History Narrative   Lives with mom, brother and sister   Social Drivers of Health   Tobacco Use: Low Risk (11/02/2024)   Patient History    Smoking Tobacco Use: Never    Smokeless Tobacco Use: Never    Passive Exposure: Not on file  Financial Resource Strain: Not on file  Food Insecurity: Not on file  Transportation Needs: Not on file  Physical  Activity: Not on file  Stress: Not on file  Social Connections: Not on file  Intimate Partner Violence: Not on file  Depression (PHQ2-9): High Risk (11/02/2024)   Depression (PHQ2-9)    PHQ-2 Score: 11  Alcohol Screen: Not on file  Housing: Not on file  Utilities: Not on file  Health Literacy: Not on file    Past Surgical History:  Procedure Laterality Date   no surgical history      Family History  Problem Relation Age of Onset   Asthma Mother    Eczema Sister    Asthma Brother    Allergic rhinitis Neg Hx    Angioedema Neg Hx    Immunodeficiency Neg Hx    Urticaria Neg Hx     Allergies[1]  Medications Ordered Prior to Encounter[2]  BP 120/73 (BP Location: Left Arm, Patient Position: Sitting, Cuff Size: Normal)   Pulse 82   Temp 98.2 F (36.8 C) (Oral)   Resp 16   Ht 5' 4 (1.626 m)   Wt 177 lb (80.3 kg)   LMP 11/02/2024 (Exact Date)   SpO2 100%   BMI 30.38 kg/m chart      Objective:    BP 120/73 (BP Location: Left Arm, Patient Position: Sitting, Cuff Size: Normal)  Pulse 82   Temp 98.2 F (36.8 C) (Oral)   Resp 16   Ht 5' 4 (1.626 m)   Wt 177 lb (80.3 kg)   LMP 11/02/2024 (Exact Date)   SpO2 100%   BMI 30.38 kg/m    Physical Exam Vitals and nursing note reviewed.  Constitutional:      Appearance: Normal appearance. She is obese.  Cardiovascular:     Rate and Rhythm: Normal rate and regular rhythm.     Pulses: Normal pulses.     Heart sounds: Normal heart sounds.  Pulmonary:     Effort: Pulmonary effort is normal.     Breath sounds: Normal breath sounds.  Abdominal:     General: Abdomen is flat. Bowel sounds are normal.     Palpations: Abdomen is soft.  Musculoskeletal:        General: Normal range of motion.     Cervical back: Normal range of motion and neck supple.  Skin:    General: Skin is warm and dry.  Neurological:     General: No focal deficit present.     Mental Status: She is alert and oriented to person, place, and time.  Mental status is at baseline.  Psychiatric:        Mood and Affect: Mood normal.        Behavior: Behavior normal.        Thought Content: Thought content normal.        Judgment: Judgment normal.     No results found for any visits on 11/02/24.      Assessment & Plan:  Elaine Bright was seen today for menstrual problem.  Diagnoses and all orders for this visit:  Dysmenorrhea -     TSH -     CBC w/Diff -     Iron, TIBC and Ferritin Panel -     US  Pelvic Complete With Transvaginal; Future -     Cervicovaginal ancillary only( [place]) -     POC Urinalysis Dipstick -     HIV antibody (with reflex)  Other orders -     ibuprofen  (ADVIL ) 800 MG tablet; Take 1 tablet (800 mg total) by mouth every 8 (eight) hours as needed. -     Levonorgestrel-Ethinyl Estradiol (CAMRESE LO) 0.1-0.02 & 0.01 MG tablet; Take 1 tablet by mouth daily.      No orders of the defined types were placed in this encounter.  Follow-up pending the results of labs, ultrasound.  Will treat with ibuprofen  and start oral contraceptive pills. No follow-ups on file.  Johnnye Sandford B Maryclaire Stoecker, FNP      [1]  Allergies Allergen Reactions   Shellfish Allergy  Swelling   American Cockroach Itching and Cough    Pt says if it touches her she feels like her throat is closing. She has the same reaction to grass   Maple Flavoring Agent (Non-Screening) Itching  [2]  Current Outpatient Medications on File Prior to Visit  Medication Sig Dispense Refill   albuterol  (PROVENTIL ) (2.5 MG/3ML) 0.083% nebulizer solution Take 3 mLs (2.5 mg total) by nebulization every 6 (six) hours as needed for wheezing or shortness of breath. 75 mL 1   albuterol  (VENTOLIN  HFA) 108 (90 Base) MCG/ACT inhaler Inhale 1-2 puffs into the lungs every 4 (four) hours as needed for wheezing or shortness of breath. 1 each 3   budesonide  (PULMICORT ) 0.5 MG/2ML nebulizer solution Take 0.25 mg by nebulization 2 (two) times daily.     Misc. Devices MISC Nebulizer  device. Diagnosis - asthma 1 each 0   mometasone -formoterol  (DULERA) 100-5 MCG/ACT AERO Inhale 2 puffs into the lungs 2 (two) times daily. 1 each 0   No current facility-administered medications on file prior to visit.   "

## 2024-11-03 ENCOUNTER — Ambulatory Visit: Payer: Self-pay | Admitting: Family

## 2024-11-03 LAB — CBC WITH DIFFERENTIAL/PLATELET
Basophils Absolute: 0.1 K/uL (ref 0.0–0.1)
Basophils Relative: 0.9 % (ref 0.0–3.0)
Eosinophils Absolute: 0.1 K/uL (ref 0.0–0.7)
Eosinophils Relative: 2 % (ref 0.0–5.0)
HCT: 35.9 % — ABNORMAL LOW (ref 36.0–46.0)
Hemoglobin: 11.8 g/dL — ABNORMAL LOW (ref 12.0–15.0)
Lymphocytes Relative: 27.4 % (ref 12.0–46.0)
Lymphs Abs: 1.7 K/uL (ref 0.7–4.0)
MCHC: 33 g/dL (ref 30.0–36.0)
MCV: 78.4 fl (ref 78.0–100.0)
Monocytes Absolute: 0.6 K/uL (ref 0.1–1.0)
Monocytes Relative: 9.8 % (ref 3.0–12.0)
Neutro Abs: 3.8 K/uL (ref 1.4–7.7)
Neutrophils Relative %: 59.9 % (ref 43.0–77.0)
Platelets: 362 K/uL (ref 150.0–400.0)
RBC: 4.58 Mil/uL (ref 3.87–5.11)
RDW: 16.8 % — ABNORMAL HIGH (ref 11.5–14.6)
WBC: 6.3 K/uL (ref 4.5–10.5)

## 2024-11-03 LAB — IRON,TIBC AND FERRITIN PANEL
%SAT: 7 % — ABNORMAL LOW (ref 16–45)
Ferritin: 12 ng/mL — ABNORMAL LOW (ref 16–154)
Iron: 24 ug/dL — ABNORMAL LOW (ref 40–190)
TIBC: 346 ug/dL (ref 250–450)

## 2024-11-03 LAB — HIV ANTIBODY (ROUTINE TESTING W REFLEX)
HIV 1&2 Ab, 4th Generation: NONREACTIVE
HIV FINAL INTERPRETATION: NEGATIVE

## 2024-11-03 LAB — TSH: TSH: 2.6 u[IU]/mL (ref 0.35–5.50)

## 2024-11-15 ENCOUNTER — Ambulatory Visit (HOSPITAL_BASED_OUTPATIENT_CLINIC_OR_DEPARTMENT_OTHER): Admission: RE | Admit: 2024-11-15 | Source: Ambulatory Visit

## 2024-11-17 ENCOUNTER — Ambulatory Visit (HOSPITAL_BASED_OUTPATIENT_CLINIC_OR_DEPARTMENT_OTHER)
Admission: RE | Admit: 2024-11-17 | Discharge: 2024-11-17 | Disposition: A | Discharge status: Home / Self Care | Source: Ambulatory Visit | Attending: Family | Admitting: Family

## 2024-11-17 DIAGNOSIS — N946 Dysmenorrhea, unspecified: Secondary | ICD-10-CM | POA: Insufficient documentation
